# Patient Record
Sex: Male | Born: 1960 | Race: White | Hispanic: No | Marital: Married | Smoking: Never smoker
Health system: Southern US, Community
[De-identification: ages and names within clinical notes are randomized; demographics above are authoritative.]

## PROBLEM LIST (undated history)

## (undated) DIAGNOSIS — E785 Hyperlipidemia, unspecified: Secondary | ICD-10-CM

## (undated) HISTORY — DX: Hyperlipidemia, unspecified: E78.5

---

## 2004-12-03 ENCOUNTER — Emergency Department (HOSPITAL_COMMUNITY): Admission: EM | Admit: 2004-12-03 | Discharge: 2004-12-04 | Payer: Self-pay | Admitting: *Deleted

## 2007-05-23 ENCOUNTER — Emergency Department (HOSPITAL_COMMUNITY): Admission: EM | Admit: 2007-05-23 | Discharge: 2007-05-23 | Payer: Self-pay | Admitting: Emergency Medicine

## 2010-02-08 ENCOUNTER — Encounter: Admission: RE | Admit: 2010-02-08 | Discharge: 2010-02-08 | Payer: Self-pay | Admitting: Family Medicine

## 2010-08-05 ENCOUNTER — Encounter: Payer: Self-pay | Admitting: Family Medicine

## 2011-08-28 DIAGNOSIS — L239 Allergic contact dermatitis, unspecified cause: Secondary | ICD-10-CM | POA: Insufficient documentation

## 2013-06-23 ENCOUNTER — Encounter: Payer: Self-pay | Admitting: Internal Medicine

## 2013-07-20 ENCOUNTER — Other Ambulatory Visit: Payer: Self-pay | Admitting: Family Medicine

## 2013-07-20 DIAGNOSIS — F17200 Nicotine dependence, unspecified, uncomplicated: Secondary | ICD-10-CM

## 2013-07-26 ENCOUNTER — Ambulatory Visit
Admission: RE | Admit: 2013-07-26 | Discharge: 2013-07-26 | Disposition: A | Discharge status: Home / Self Care | Payer: No Typology Code available for payment source | Source: Ambulatory Visit | Attending: Family Medicine | Admitting: Family Medicine

## 2013-07-26 DIAGNOSIS — F17200 Nicotine dependence, unspecified, uncomplicated: Secondary | ICD-10-CM

## 2013-08-16 ENCOUNTER — Encounter: Payer: Self-pay | Admitting: Internal Medicine

## 2014-05-31 ENCOUNTER — Other Ambulatory Visit: Payer: Self-pay | Admitting: Family Medicine

## 2014-05-31 DIAGNOSIS — N5089 Other specified disorders of the male genital organs: Secondary | ICD-10-CM

## 2014-06-06 ENCOUNTER — Other Ambulatory Visit: Payer: Self-pay | Admitting: Family Medicine

## 2014-06-06 ENCOUNTER — Ambulatory Visit
Admission: RE | Admit: 2014-06-06 | Discharge: 2014-06-06 | Disposition: A | Discharge status: Home / Self Care | Payer: BC Managed Care – PPO | Source: Ambulatory Visit | Attending: Family Medicine | Admitting: Family Medicine

## 2014-06-06 DIAGNOSIS — M542 Cervicalgia: Secondary | ICD-10-CM

## 2014-06-06 DIAGNOSIS — N5089 Other specified disorders of the male genital organs: Secondary | ICD-10-CM

## 2014-07-20 ENCOUNTER — Other Ambulatory Visit: Payer: Self-pay | Admitting: Family Medicine

## 2014-07-20 DIAGNOSIS — R911 Solitary pulmonary nodule: Secondary | ICD-10-CM

## 2014-07-27 ENCOUNTER — Ambulatory Visit
Admission: RE | Admit: 2014-07-27 | Discharge: 2014-07-27 | Disposition: A | Discharge status: Home / Self Care | Payer: No Typology Code available for payment source | Source: Ambulatory Visit | Attending: Family Medicine | Admitting: Family Medicine

## 2014-07-27 DIAGNOSIS — R911 Solitary pulmonary nodule: Secondary | ICD-10-CM

## 2015-07-31 ENCOUNTER — Other Ambulatory Visit: Payer: Self-pay | Admitting: Family Medicine

## 2015-07-31 DIAGNOSIS — Z129 Encounter for screening for malignant neoplasm, site unspecified: Secondary | ICD-10-CM

## 2015-08-04 ENCOUNTER — Ambulatory Visit
Admission: RE | Admit: 2015-08-04 | Discharge: 2015-08-04 | Disposition: A | Discharge status: Home / Self Care | Payer: No Typology Code available for payment source | Source: Ambulatory Visit | Attending: Family Medicine | Admitting: Family Medicine

## 2015-08-04 DIAGNOSIS — Z129 Encounter for screening for malignant neoplasm, site unspecified: Secondary | ICD-10-CM

## 2016-05-29 ENCOUNTER — Other Ambulatory Visit: Payer: Self-pay | Admitting: Family Medicine

## 2016-05-29 DIAGNOSIS — M5412 Radiculopathy, cervical region: Secondary | ICD-10-CM

## 2016-06-18 ENCOUNTER — Other Ambulatory Visit: Payer: Self-pay

## 2016-06-21 ENCOUNTER — Ambulatory Visit
Admission: RE | Admit: 2016-06-21 | Discharge: 2016-06-21 | Disposition: A | Discharge status: Home / Self Care | Payer: BLUE CROSS/BLUE SHIELD | Source: Ambulatory Visit | Attending: Family Medicine | Admitting: Family Medicine

## 2016-06-21 DIAGNOSIS — M5412 Radiculopathy, cervical region: Secondary | ICD-10-CM

## 2016-06-21 MED ORDER — TRIAMCINOLONE ACETONIDE 40 MG/ML IJ SUSP (RADIOLOGY)
60.0000 mg | Freq: Once | INTRAMUSCULAR | Status: AC
  Administered 2016-06-21: 60 mg via EPIDURAL

## 2016-06-21 MED ORDER — IOHEXOL 300 MG/ML  SOLN
1.0000 mL | Freq: Once | INTRAMUSCULAR | Status: AC | PRN
  Administered 2016-06-21: 1 mL via EPIDURAL

## 2016-06-21 NOTE — Discharge Instructions (Signed)

## 2016-08-05 ENCOUNTER — Other Ambulatory Visit: Payer: Self-pay | Admitting: Family Medicine

## 2016-08-05 DIAGNOSIS — F17200 Nicotine dependence, unspecified, uncomplicated: Secondary | ICD-10-CM

## 2016-08-28 ENCOUNTER — Ambulatory Visit: Payer: BLUE CROSS/BLUE SHIELD

## 2016-09-03 ENCOUNTER — Ambulatory Visit
Admission: RE | Admit: 2016-09-03 | Discharge: 2016-09-03 | Disposition: A | Discharge status: Home / Self Care | Payer: BLUE CROSS/BLUE SHIELD | Source: Ambulatory Visit | Attending: Family Medicine | Admitting: Family Medicine

## 2016-09-03 DIAGNOSIS — F17200 Nicotine dependence, unspecified, uncomplicated: Secondary | ICD-10-CM

## 2017-09-24 ENCOUNTER — Other Ambulatory Visit: Payer: Self-pay | Admitting: Family Medicine

## 2017-09-24 DIAGNOSIS — Z87891 Personal history of nicotine dependence: Secondary | ICD-10-CM

## 2017-10-07 ENCOUNTER — Ambulatory Visit: Payer: Self-pay

## 2017-10-15 ENCOUNTER — Ambulatory Visit: Payer: Self-pay

## 2017-10-24 ENCOUNTER — Ambulatory Visit: Payer: Self-pay

## 2017-11-03 ENCOUNTER — Ambulatory Visit
Admission: RE | Admit: 2017-11-03 | Discharge: 2017-11-03 | Disposition: A | Discharge status: Home / Self Care | Payer: BLUE CROSS/BLUE SHIELD | Source: Ambulatory Visit | Attending: Family Medicine | Admitting: Family Medicine

## 2017-11-03 DIAGNOSIS — Z87891 Personal history of nicotine dependence: Secondary | ICD-10-CM

## 2018-10-28 ENCOUNTER — Telehealth: Payer: Self-pay

## 2018-10-28 ENCOUNTER — Telehealth: Payer: Self-pay | Admitting: Cardiology

## 2018-10-28 DIAGNOSIS — R55 Syncope and collapse: Secondary | ICD-10-CM

## 2018-10-28 NOTE — Telephone Encounter (Signed)
Could not reach pt so called PCP RN Roanna Raider at Dr. Polly Cobia office [phone])  regarding PCP request for urgent OV with [place].   4/13 Pt had loss of consciousness.  Has been having dizzy spells for months prior. Dr. Duaine Dredge (pts PCP) feels it may be an issue with pts heart and prefers pt to be seen in office by Dr. Elberta Fortis.   Routed original Triage message to DOD 4/15 to Dr. Mayford Knife who advised pt should be seen by DOD tomorrow 4/16. Pt did not answer and has not returned call at this time to schedule appt for tomorrow. Will route to [employer] who is DOD tomorrow as well as their primary nurses.

## 2018-10-28 NOTE — Telephone Encounter (Signed)
This message came to me as an urgent referral for an in office appointment. I am forwarding to you to triage for an in office versus virtual visit. Please advise

## 2018-10-28 NOTE — Telephone Encounter (Signed)
Routed to [place], Charity fundraiser and [employer].   Called patient and LMTCB regarding referral from PCP.  Pt may potentially need appt with DOD tomorrow according to his symptoms, etc.

## 2018-10-28 NOTE — Telephone Encounter (Signed)
Originally sent this to [facility], did not realize she was off today.Pt needs to be triaged to determine if he needs an in office visit.

## 2018-10-28 NOTE — Telephone Encounter (Signed)
  Jacob Norris is calling with an urgent referral and Dr Duaine Dredge wants the patient to be seen this week in person if possible.  Patient has been having episodes of syncope.Please contact the office or the patient to get scheduled. Jacob Norris will be faxing over referral now to the [employer].

## 2018-10-28 NOTE — Telephone Encounter (Signed)
Needs to be seen by DOD tomorrow

## 2018-10-29 ENCOUNTER — Ambulatory Visit: Payer: BLUE CROSS/BLUE SHIELD | Admitting: Cardiovascular Disease

## 2018-10-29 ENCOUNTER — Telehealth: Payer: Self-pay | Admitting: Interventional Cardiology

## 2018-10-29 NOTE — Telephone Encounter (Signed)
New Message  Patient's wife returning your call.

## 2018-10-29 NOTE — Telephone Encounter (Signed)
Called again and spoke to the patient's wife. Echo scheduled for 2:00 PM. Understands patient needs to arrive at 1:30 PM.

## 2018-10-29 NOTE — Telephone Encounter (Signed)
Spoke to wife, pt is not home. Asked wife to have pt call and speak to triage nurse when he returns in about an hour. Explained we are trying to schedule him to be seen in office today. Wife will have pt call to discuss when he returns home.

## 2018-10-29 NOTE — Telephone Encounter (Signed)
Per Dr. Eldridge Dace, patient needs echo tomorrow with his appointment. Got approval to schedule at 2:00 PM.   Attempted to contact patient to have patient come early but there was no answer. Left message for patient to call back. Will keep trying. Order has been placed.

## 2018-10-29 NOTE — Telephone Encounter (Signed)
Pt reports syncopal episode on Monday, 4/13.  States this has never happened before.  He does report dizziness before episode occurred. Pt scheduled to see DOD this afternoon to discuss further. COVID screening completed, see below.     Cardiac Questionnaire:    Since your last visit or hospitalization:    1. Have you been having new or worsening chest pain? no   2. Have you been having new or worsening shortness of breath? no 3. Have you been having new or worsening leg swelling, wt gain, or increase in abdominal girth (pants fitting more tightly)? no   4. Have you had any passing out spells? yes    *A YES to any of these questions would result in the appointment being kept. *If all the answers to these questions are NO, we should indicate that given the current situation regarding the worldwide coronarvirus pandemic, at the recommendation of the CDC, we are looking to limit gatherings in our waiting area, and thus will reschedule their appointment beyond four weeks from today.   _____________   COVID-19 Pre-Screening Questions:  . Do you currently have a fever? NO (yes = cancel and refer to pcp for e-visit)  . Have you recently travelled on a cruise, internationally, or to [place], [place], [place], [place], [place], or [place], [place] [employer]) ? NO (yes = cancel, stay home, monitor symptoms, and contact pcp or initiate e-visit if symptoms develop) . Have you been in contact with someone that is currently pending confirmation of Covid19 testing or has been confirmed to have the Covid19 virus?  NO (yes = cancel, stay home, away from tested individual, monitor symptoms, and contact pcp or initiate e-visit if symptoms develop) . Are you currently experiencing fatigue or cough? NO (yes = pt should be prepared to have a mask placed at the time of their visit).

## 2018-10-29 NOTE — Telephone Encounter (Signed)
PCP prefers pt to be seen by another physician in our office.  Pt rescheduled to tomorrow's DOD schedule w/ Eldridge Dace. Pt agreeable to plan.

## 2018-10-29 NOTE — Progress Notes (Signed)
Cardiology Office Note   Date:  10/30/2018   ID:  Jacob Norris, DOB 15-Nov-1960, MRN [id number]  PCP:  Mosetta Putt, MD    No chief complaint on file.  Syncope  Wt Readings from Last 3 Encounters:  10/30/18 181 lb (82.1 kg)       History of Present Illness: Jacob Norris is a 58 y.o. male who is being seen today for the evaluation of syncope at the request of Mosetta Putt, MD.  He has noticed getting dizzy when he stands up quickly. This has been happening for 4 months, since early Dec 2019. Prior to this, he was having a lot of stress.   A week ago, he got up to reset the cable box and then woke up on the floor.    No alcohol of soda use.  He drinks water and coffee.  He drinks the largest size Starbucks coffee during the day.  He has been exercising more, and lost 30 lbs in the past 6 months.   Denies : Chest pain.  Leg edema. Nitroglycerin use. Orthopnea. Palpitations. Paroxysmal nocturnal dyspnea. Shortness of breath.    He has been doing very strenuous workouts on an exercise bike.  He sweats a lot and he does not think he has been drinking adequate water.  Of note, he saw his primary care doctor yesterday.  Urine sample was done which showed an elevated specific gravity of 1.037 and the urine was dark yellow.   Past Medical History:  Diagnosis Date  . Hyperlipidemia     History reviewed. No pertinent surgical history.   Current Outpatient Medications  Medication Sig Dispense Refill  . azathioprine (IMURAN) 100 MG tablet Take 50 mg by mouth daily.     . cetirizine (ZYRTEC) 10 MG tablet Take 10 mg by mouth daily.    . clobetasol (TEMOVATE) 0.05 % external solution Apply topically.    Marland Kitchen desoximetasone (TOPICORT) 0.05 % cream Apply as directed    . ketoconazole (NIZORAL) 2 % shampoo Apply 1-3 times a week to scalp. Let sit 10 min then rinse out.    . pravastatin (PRAVACHOL) 80 MG tablet Take 80 mg by mouth daily.      No current  facility-administered medications for this visit.     Allergies:   Patient has no known allergies.    Social History:  The patient  reports that he has never smoked. His smokeless tobacco use includes snuff. He reports that he does not drink alcohol or use drugs.   Family History:  The patient's family history includes Cancer in his mother.    ROS:  Please see the history of present illness.   Otherwise, review of systems are positive for frequent orthostatic dizziness and one episode of syncope..   All other systems are reviewed and negative.    PHYSICAL EXAM: VS:  BP 114/76   Pulse 73   Ht 7' (2.134 m)   Wt 181 lb (82.1 kg)   SpO2 99%   BMI 18.04 kg/m  , BMI Body mass index is 18.04 kg/m. GEN: Well nourished, well developed, in no acute distress  HEENT: normal  Neck: no JVD, carotid bruits, or masses Cardiac: RRR; no murmurs, rubs, or gallops,no edema  Respiratory:  clear to auscultation bilaterally, normal work of breathing GI: soft, nontender, nondistended, + BS MS: no deformity or atrophy  Skin: warm and dry, no rash Neuro:  Strength and sensation are intact Psych: euthymic mood, full affect  EKG:   The ekg ordered today demonstrates normal sinus rhythm, no ST segment changes, possible septal Q waves   Recent Labs: No results found for requested labs within last 8760 hours.   Lipid Panel No results found for: CHOL, TRIG, HDL, CHOLHDL, VLDL, LDLCALC, LDLDIRECT   Other studies Reviewed: Additional studies/ records that were reviewed today with results demonstrating: Preliminary result of the echocardiogram revealing normal left ventricular function and normal valvular function.   ASSESSMENT AND PLAN:  1. Syncope: He has been exercising a lot.  He sweats a lot during exercise.  He has lost significant weight over the past several months.  He drinks coffee and water during the day.  He drinks a very large sized Starbucks coffee, 30 ounces/day, black coffee.  I  suspect he is dehydrated. 2. Dehydration: Urine sample ordered at his primary care doctor's office showed a high specific gravity and it was dark yellow in color.  I encouraged him to drink more water.  He needs to drink twice as much water for the amount of coffee that he drinks.  He should look and see that his urine is becoming more pale in color. 3. Hyperlipidemia: Continue Pravachol.  Lipids well controlled.  Labs reviewed.   Current medicines are reviewed at length with the patient today.  The patient concerns regarding his medicines were addressed.  The following changes have been made:  No change  Labs/ tests ordered today include:  No orders of the defined types were placed in this encounter.   Recommend 150 minutes/week of aerobic exercise Low fat, low carb, high fiber diet recommended  Disposition:   FU in as needed   Signed, Lance MussJayadeep Catrena Vari, MD  10/30/2018 4:21 PM    [facility] [address], [place], [place]  [address] Phone: [phone]; Fax: [phone]

## 2018-10-29 NOTE — Addendum Note (Signed)
Addended by: Daleen Bo I on: 10/29/2018 04:43 PM   Modules accepted: Orders

## 2018-10-30 ENCOUNTER — Ambulatory Visit: Payer: BLUE CROSS/BLUE SHIELD | Admitting: Interventional Cardiology

## 2018-10-30 ENCOUNTER — Ambulatory Visit (HOSPITAL_COMMUNITY): Payer: BLUE CROSS/BLUE SHIELD | Attending: Internal Medicine

## 2018-10-30 ENCOUNTER — Other Ambulatory Visit: Payer: Self-pay

## 2018-10-30 ENCOUNTER — Encounter: Payer: Self-pay | Admitting: Interventional Cardiology

## 2018-10-30 VITALS — BP 114/76 | HR 73 | Ht >= 80 in | Wt 181.0 lb

## 2018-10-30 DIAGNOSIS — R55 Syncope and collapse: Secondary | ICD-10-CM | POA: Insufficient documentation

## 2018-10-30 DIAGNOSIS — E782 Mixed hyperlipidemia: Secondary | ICD-10-CM

## 2018-10-30 DIAGNOSIS — R42 Dizziness and giddiness: Secondary | ICD-10-CM

## 2018-10-30 NOTE — Patient Instructions (Signed)
Medication Instructions:  Your physician recommends that you continue on your current medications as directed. Please refer to the Current Medication list given to you today.  If you need a refill on your cardiac medications before your next appointment, please call your pharmacy.   Lab work: None Ordered  If you have labs (blood work) drawn today and your tests are completely normal, you will receive your results only by: Marland Kitchen MyChart Message (if you have MyChart) OR . A paper copy in the mail If you have any lab test that is abnormal or we need to change your treatment, we will call you to review the results.  Testing/Procedures: None ordered  Follow-Up: AS NEEDED  Any Other Special Instructions Will Be Listed Below (If Applicable).  STAY HYDRATED

## 2018-11-02 NOTE — Telephone Encounter (Signed)
See telephone note from 4-15. 

## 2018-11-04 NOTE — Addendum Note (Signed)
Addended by: Daleen Bo I on: 11/04/2018 08:56 AM   Modules accepted: Orders

## 2019-04-06 ENCOUNTER — Other Ambulatory Visit: Payer: Self-pay | Admitting: Family Medicine

## 2019-04-06 DIAGNOSIS — Z122 Encounter for screening for malignant neoplasm of respiratory organs: Secondary | ICD-10-CM

## 2019-04-14 ENCOUNTER — Ambulatory Visit: Payer: BLUE CROSS/BLUE SHIELD

## 2019-04-23 ENCOUNTER — Ambulatory Visit: Payer: BC Managed Care – PPO

## 2019-05-07 ENCOUNTER — Other Ambulatory Visit: Payer: Self-pay

## 2019-05-07 ENCOUNTER — Ambulatory Visit
Admission: RE | Admit: 2019-05-07 | Discharge: 2019-05-07 | Disposition: A | Discharge status: Home / Self Care | Payer: BC Managed Care – PPO | Source: Ambulatory Visit | Attending: Family Medicine | Admitting: Family Medicine

## 2019-05-07 DIAGNOSIS — Z122 Encounter for screening for malignant neoplasm of respiratory organs: Secondary | ICD-10-CM

## 2020-10-31 ENCOUNTER — Other Ambulatory Visit: Payer: Self-pay | Admitting: Family Medicine

## 2020-10-31 DIAGNOSIS — Z122 Encounter for screening for malignant neoplasm of respiratory organs: Secondary | ICD-10-CM

## 2020-11-14 ENCOUNTER — Other Ambulatory Visit: Payer: Self-pay

## 2020-11-14 ENCOUNTER — Ambulatory Visit
Admission: RE | Admit: 2020-11-14 | Discharge: 2020-11-14 | Disposition: A | Discharge status: Home / Self Care | Payer: Self-pay | Source: Ambulatory Visit | Attending: Family Medicine | Admitting: Family Medicine

## 2020-11-14 DIAGNOSIS — Z122 Encounter for screening for malignant neoplasm of respiratory organs: Secondary | ICD-10-CM

## 2021-11-28 ENCOUNTER — Other Ambulatory Visit (HOSPITAL_COMMUNITY): Payer: Self-pay | Admitting: Family Medicine

## 2021-11-28 ENCOUNTER — Other Ambulatory Visit: Payer: Self-pay | Admitting: Family Medicine

## 2021-11-28 DIAGNOSIS — Z87891 Personal history of nicotine dependence: Secondary | ICD-10-CM

## 2021-11-28 DIAGNOSIS — E782 Mixed hyperlipidemia: Secondary | ICD-10-CM

## 2021-11-30 ENCOUNTER — Other Ambulatory Visit (HOSPITAL_COMMUNITY): Payer: BC Managed Care – PPO

## 2021-12-03 ENCOUNTER — Ambulatory Visit (HOSPITAL_COMMUNITY)
Admission: RE | Admit: 2021-12-03 | Discharge: 2021-12-03 | Disposition: A | Discharge status: Home / Self Care | Payer: Self-pay | Source: Ambulatory Visit | Attending: Family Medicine | Admitting: Family Medicine

## 2021-12-03 DIAGNOSIS — E782 Mixed hyperlipidemia: Secondary | ICD-10-CM | POA: Insufficient documentation

## 2021-12-07 ENCOUNTER — Ambulatory Visit
Admission: RE | Admit: 2021-12-07 | Discharge: 2021-12-07 | Disposition: A | Discharge status: Home / Self Care | Payer: BC Managed Care – PPO | Source: Ambulatory Visit | Attending: Family Medicine | Admitting: Family Medicine

## 2021-12-07 DIAGNOSIS — Z87891 Personal history of nicotine dependence: Secondary | ICD-10-CM

## 2022-11-11 ENCOUNTER — Encounter (HOSPITAL_COMMUNITY): Payer: Self-pay | Admitting: Psychiatry

## 2022-11-11 ENCOUNTER — Telehealth (HOSPITAL_COMMUNITY): Payer: Self-pay | Admitting: Psychiatry

## 2022-11-11 ENCOUNTER — Telehealth (HOSPITAL_BASED_OUTPATIENT_CLINIC_OR_DEPARTMENT_OTHER): Payer: BC Managed Care – PPO | Admitting: Psychiatry

## 2022-11-11 VITALS — Wt 174.0 lb

## 2022-11-11 DIAGNOSIS — F4312 Post-traumatic stress disorder, chronic: Secondary | ICD-10-CM | POA: Diagnosis not present

## 2022-11-11 DIAGNOSIS — F330 Major depressive disorder, recurrent, mild: Secondary | ICD-10-CM | POA: Diagnosis not present

## 2022-11-11 DIAGNOSIS — Z630 Problems in relationship with spouse or partner: Secondary | ICD-10-CM
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
## 2022-12-22 IMAGING — CT CT CHEST LUNG CANCER SCREENING LOW DOSE W/O CM
2 of 5 series · 15 of 40 positions shown, 18 images · non-contrast
Comparison: 11/14/2020 screening chest CT.

CLINICAL DATA: 61-year-old asymptomatic male former smoker with 50
pack-year smoking history, quit smoking 11 years prior.



[Series 4: lung 1.00 br44 cor · coronal · 0.77mm/px · 3 of 269 slices shown]
[im 54/269  lung]
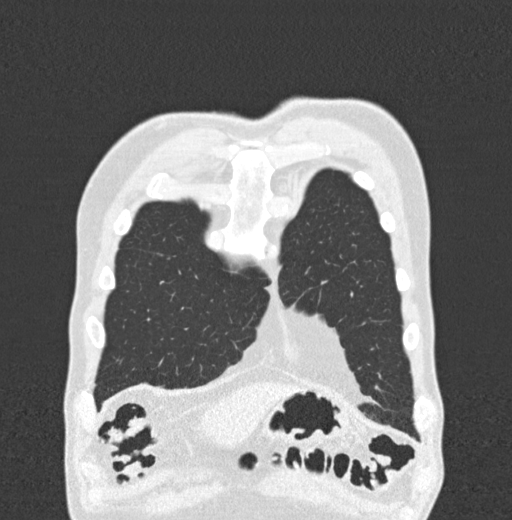
[im 108/269  lung]
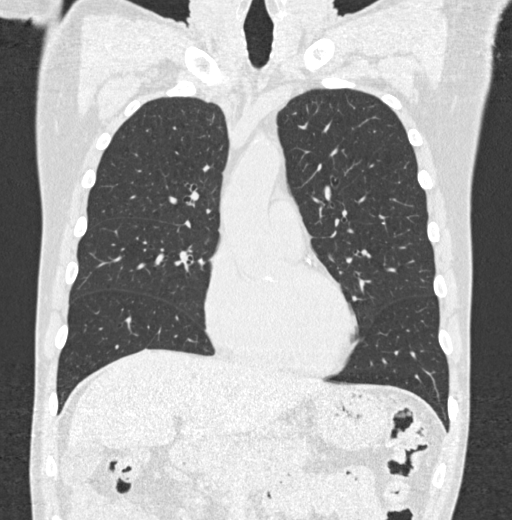
[im 161/269  lung]
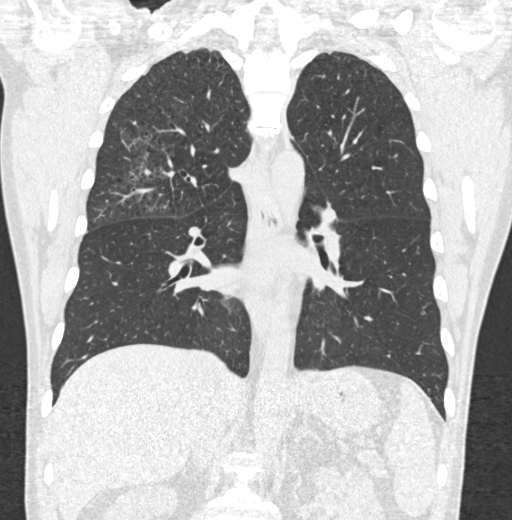

[Series 9: lung 1.00 br60 axial · axial · 0.77mm/px · z∈[-1378,-1014]mm · 12 of 402 slices shown, 15 images]
[im 19/402  mediastinal]
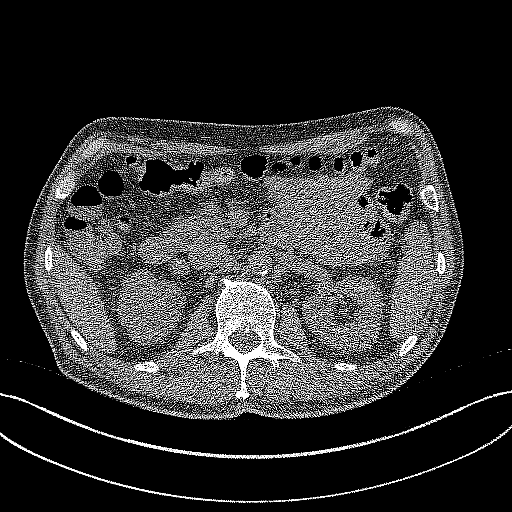
[im 19/402  lung]
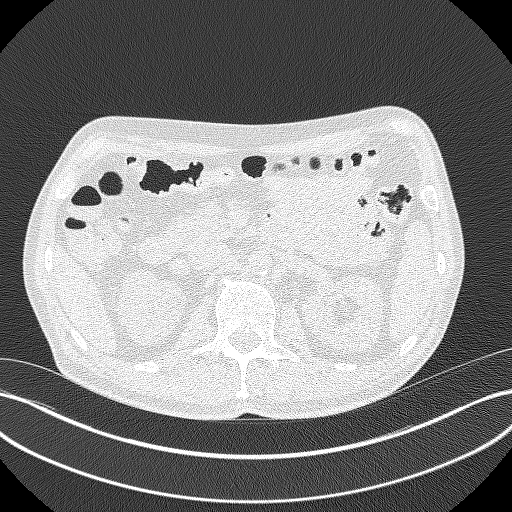
[im 55/402  lung]
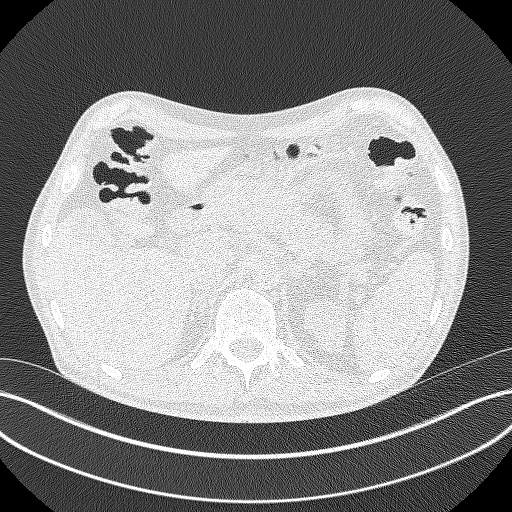
[im 92/402  lung]
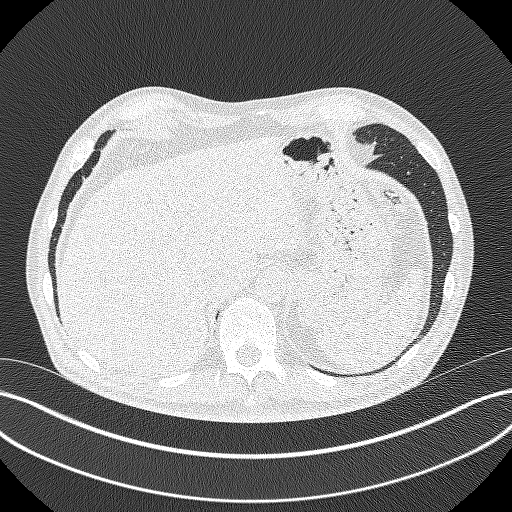
[im 128/402  lung]
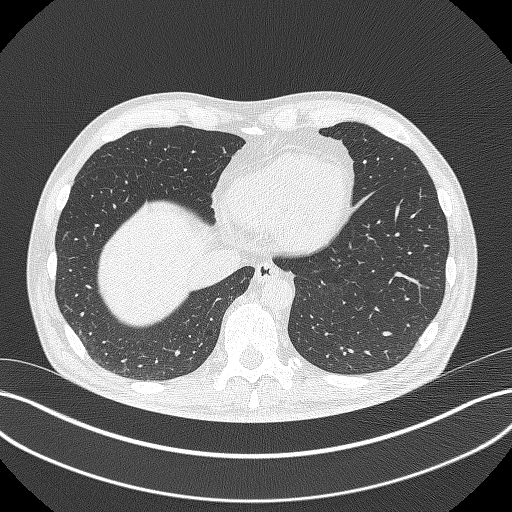
[im 146/402  mediastinal]
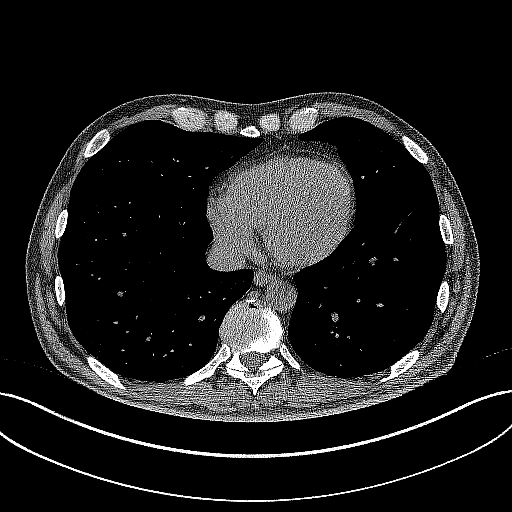
[im 146/402  lung]
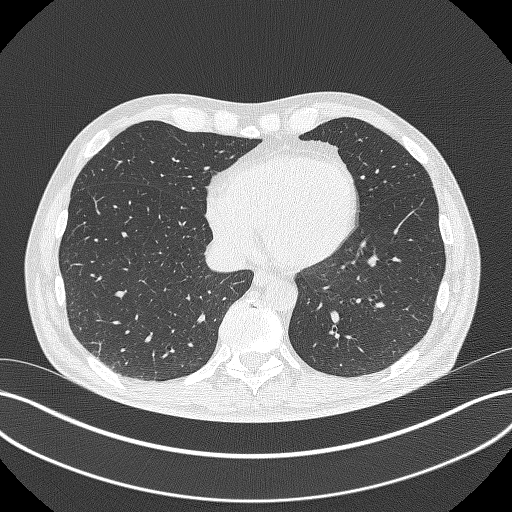
[im 183/402  lung]
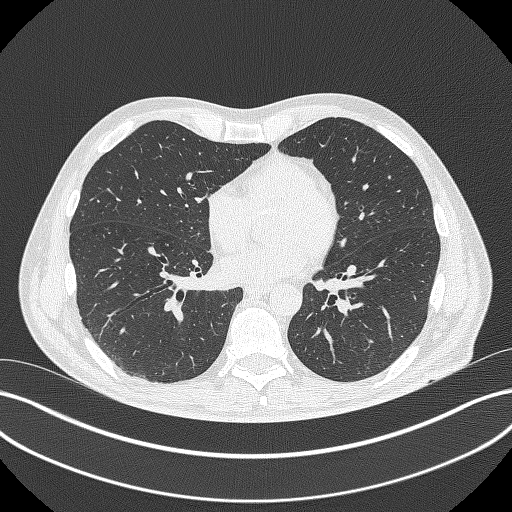
[im 219/402  lung]
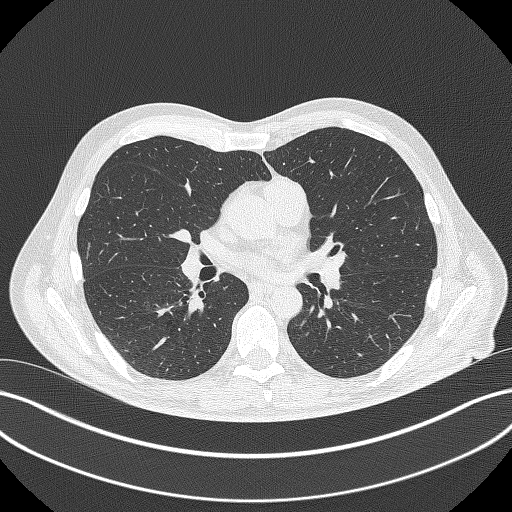
[im 256/402  lung]
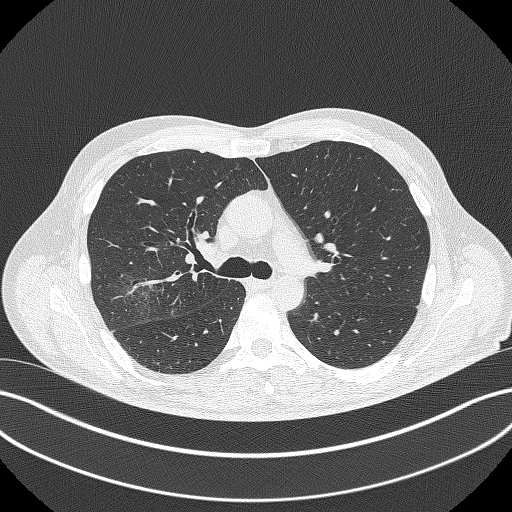
[im 274/402  mediastinal]
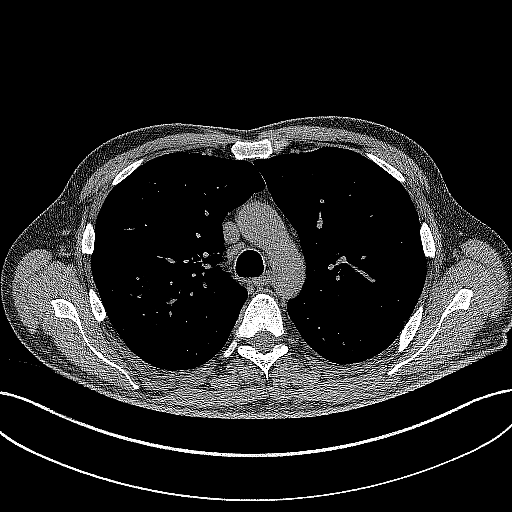
[im 274/402  lung]
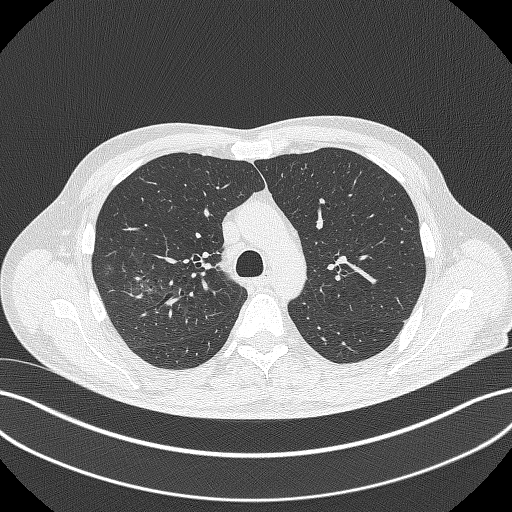
[im 310/402  lung]
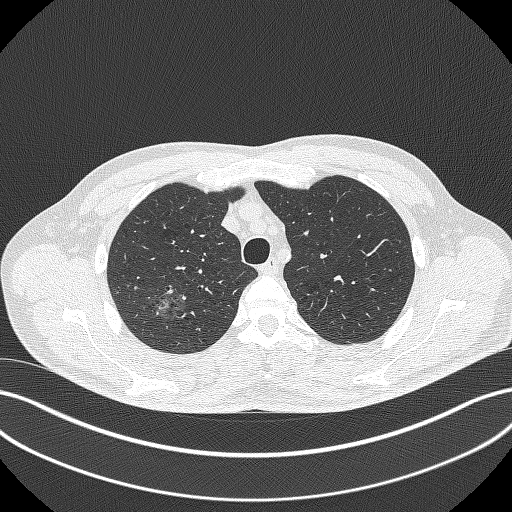
[im 347/402  lung]
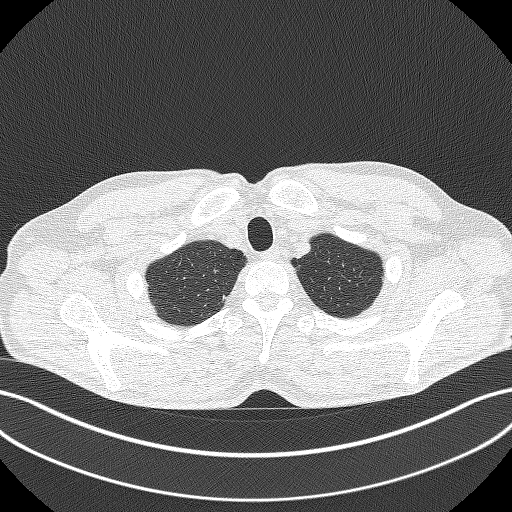
[im 383/402  lung]
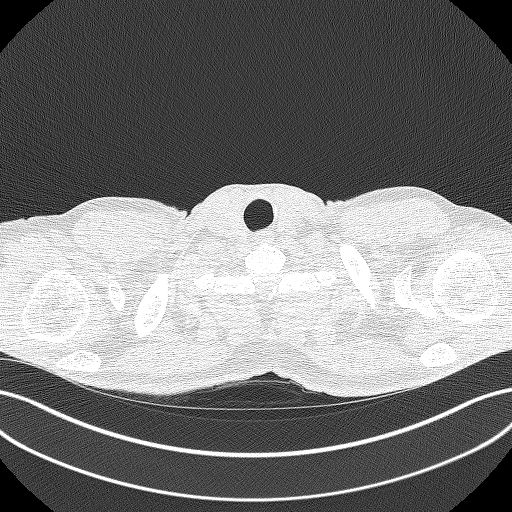

[15 of 40 positions shown; findings below may reference images not displayed]

FINDINGS: Cardiovascular: Normal heart size. No significant pericardial
effusion/thickening. Left anterior descending and right coronary
atherosclerosis. Atherosclerotic nonaneurysmal thoracic aorta.
Normal caliber pulmonary arteries.

Mediastinum/Nodes: No discrete thyroid nodules. Unremarkable
esophagus. No pathologically enlarged axillary, mediastinal or hilar
lymph nodes, noting limited sensitivity for the detection of hilar
adenopathy on this noncontrast study.

Lungs/Pleura: No pneumothorax. No pleural effusion. Moderate
centrilobular emphysema with diffuse bronchial wall thickening. Mild
patchy ground-glass opacity throughout the posterior right upper
lobe, new. No acute consolidative airspace disease. No lung masses.
No significant growth of previously visualized pulmonary nodules. No
new significant solid pulmonary nodules.

Upper abdomen: Simple 1.4 cm medial upper right renal and 2.1 cm
anterior left upper renal cysts, for which no follow-up is
recommended.

Musculoskeletal: No aggressive appearing focal osseous lesions. Mild
thoracic spondylosis.
IMPRESSION: 1. Lung-RADS 2, benign appearance or behavior. Continue annual
screening with low-dose chest CT without contrast in 12 months.
2. Two-vessel coronary atherosclerosis.
3. Aortic Atherosclerosis (RE40E-3R2.2) and Emphysema (RE40E-MXW.R).

## 2024-08-18 ENCOUNTER — Encounter: Payer: Self-pay | Admitting: Internal Medicine
